# Patient Record
Sex: Male | Born: 1985 | Race: Black or African American | Hispanic: No | Marital: Single | Smoking: Current every day smoker
Health system: Southern US, Community
[De-identification: ages and names within clinical notes are randomized; demographics above are authoritative.]

---

## 2006-06-05 ENCOUNTER — Emergency Department: Payer: Self-pay | Admitting: Unknown Physician Specialty

## 2008-03-09 IMAGING — CR DG CHEST 2V
1 series · 2 of 2 positions shown · non-contrast
Comparison: none

REASON FOR EXAM: Cough
COMMENTS:

PROCEDURE:     DXR - DXR CHEST PA (OR AP) AND LATERAL  - June 05, 2006  [DATE]
RESULT:     PA and lateral views of the chest show the lungs are
hyperinflated consistent with COPD. The heart and pulmonary vessels are
normal. The bony structures are unremarkable.

[Series 1: view not recorded · 0.17mm/px · 2 of 2 slices shown]
[im 1/2]
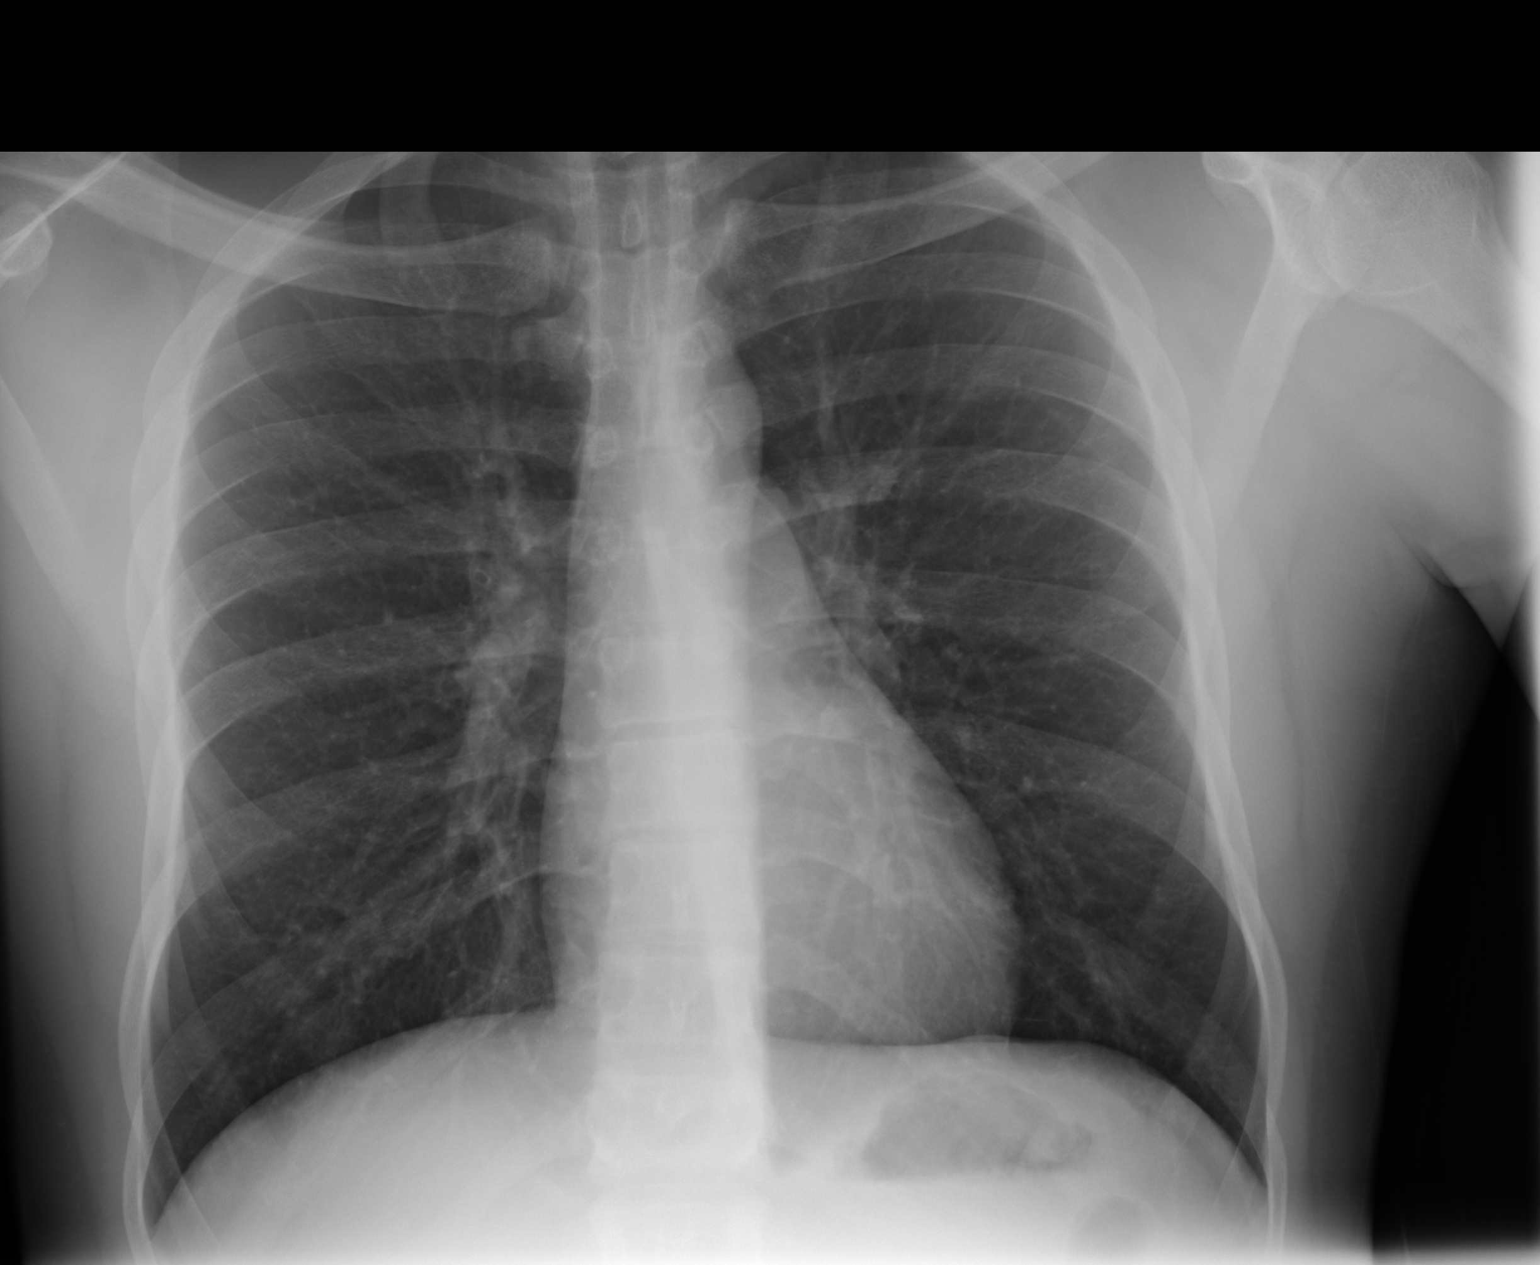
[im 2/2]
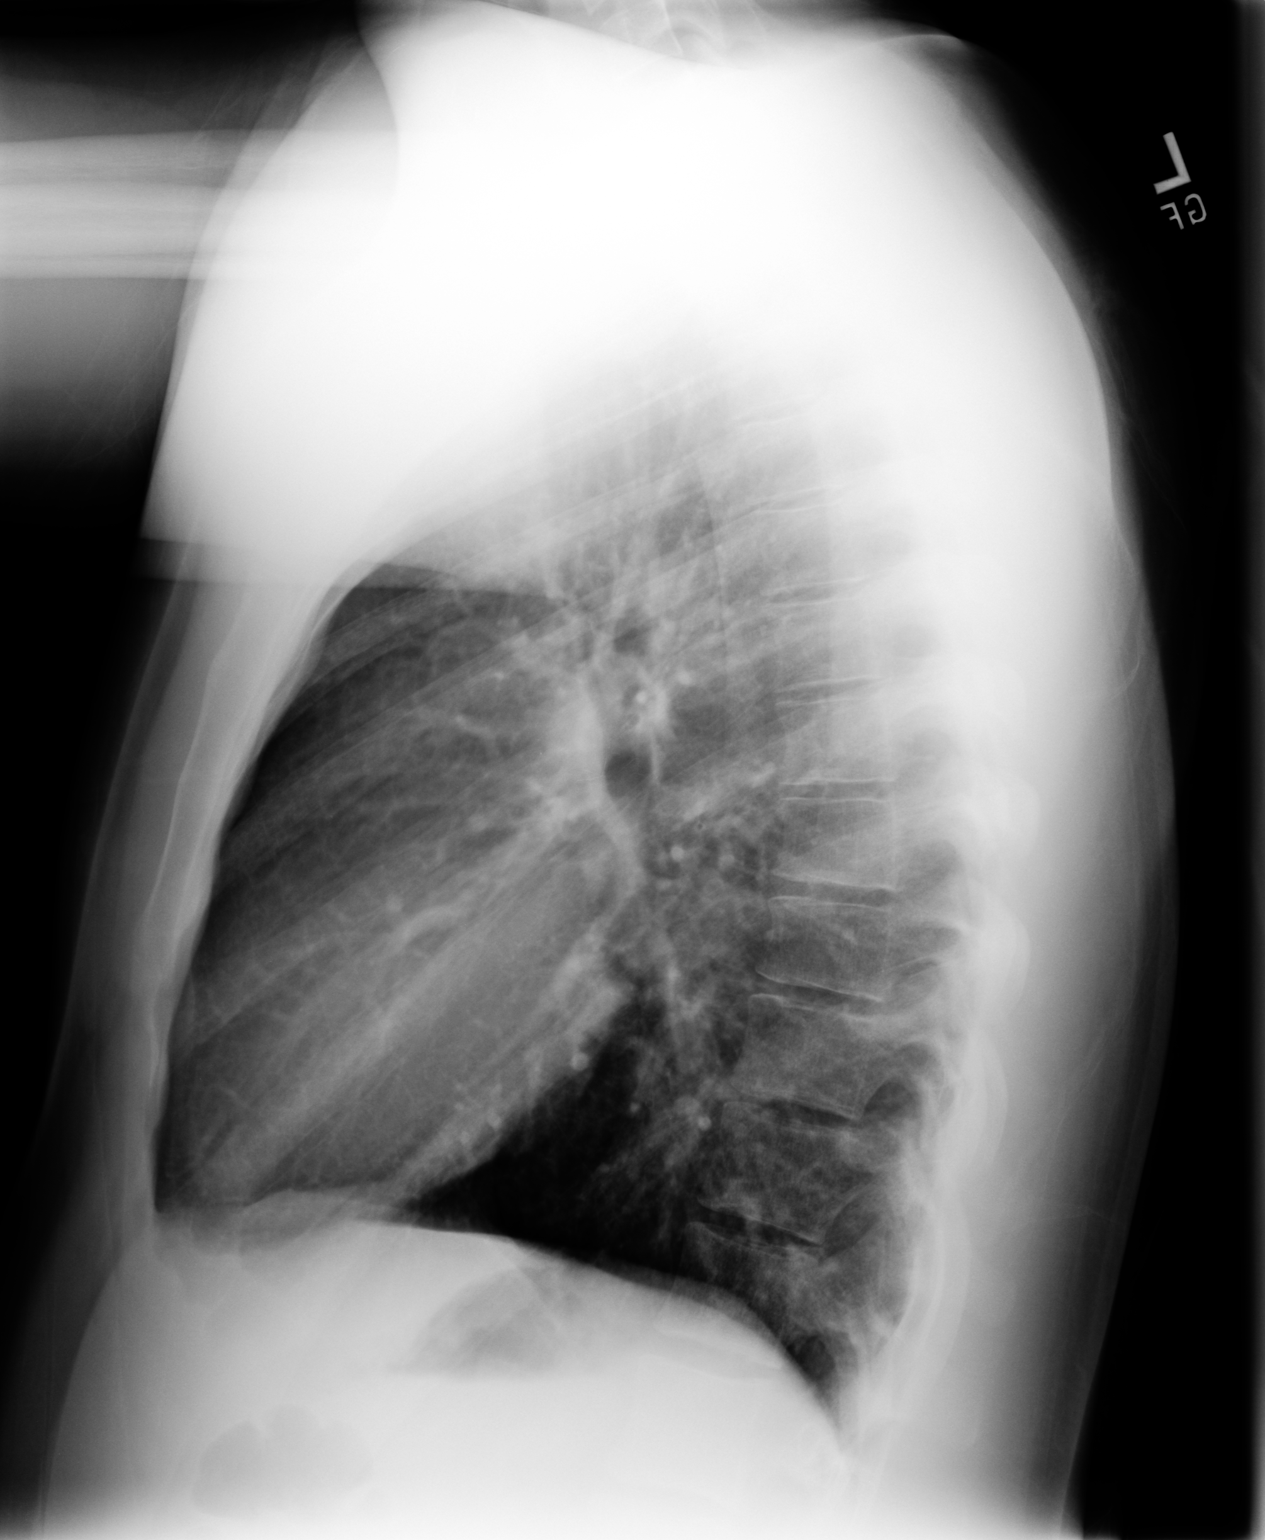

[2 of 2 positions shown; findings below may reference images not displayed]

IMPRESSION: No acute cardiopulmonary disease.

## 2014-03-01 ENCOUNTER — Emergency Department: Payer: Self-pay | Admitting: Internal Medicine

## 2015-09-10 ENCOUNTER — Emergency Department
Admission: EM | Admit: 2015-09-10 | Discharge: 2015-09-10 | Disposition: A | Discharge status: Home / Self Care | Payer: BLUE CROSS/BLUE SHIELD | Attending: Emergency Medicine | Admitting: Emergency Medicine

## 2015-09-10 ENCOUNTER — Encounter: Payer: Self-pay | Admitting: Emergency Medicine

## 2015-09-10 DIAGNOSIS — F17210 Nicotine dependence, cigarettes, uncomplicated: Secondary | ICD-10-CM | POA: Diagnosis not present

## 2015-09-10 DIAGNOSIS — R51 Headache: Secondary | ICD-10-CM | POA: Diagnosis present

## 2015-09-10 DIAGNOSIS — K0889 Other specified disorders of teeth and supporting structures: Secondary | ICD-10-CM | POA: Insufficient documentation

## 2015-09-10 MED ORDER — HYDROCODONE-ACETAMINOPHEN 5-325 MG PO TABS: 1.0000 | ORAL_TABLET | ORAL | Status: AC | PRN

## 2015-09-10 MED ORDER — IBUPROFEN 800 MG PO TABS: 800.0000 mg | ORAL_TABLET | Freq: Three times a day (TID) | ORAL | Status: AC | PRN

## 2015-09-10 MED ORDER — OXYCODONE-ACETAMINOPHEN 5-325 MG PO TABS
1.0000 | ORAL_TABLET | ORAL | Status: DC | PRN
  Administered 2015-09-10: 1 via ORAL
  Filled 2015-09-10: qty 1

## 2015-09-10 MED ORDER — LIDOCAINE VISCOUS 2 % MT SOLN: 20.0000 mL | OROMUCOSAL | Status: AC | PRN

## 2015-09-10 NOTE — Discharge Instructions (Signed)
OPTIONS FOR DENTAL FOLLOW UP CARE ° °[place] [facility] and [facility] - Local Safety Net Dental Clinics °http://www.ncdhhs.gov/dph/oralhealth/services/safetynetclinics.htm °  °[facility] (336-562-3123) ° °[facility] [place] (919-933-9087) ° °[facility] ([phone] ext [phone]) ° °[facility] ([phone]) ° °[facility] ([phone]) °This clinic caters to the indigent population and is on a lottery system. °Location: °[facility], [address], [place] °Clinic Hours: °Wednesdays from 6pm - 9pm, patients seen by a lottery system. °For dates, call or go to [email or web address].[facility].edu/[facility]/patients/Dental-[facility] °Services: °Cleanings, fillings and simple extractions. °Payment Options: °DENTAL WORK IS FREE OF CHARGE. Bring proof of income or support. °Best way to get seen: °Arrive at 5:15 pm - this is a lottery, NOT first come/first serve, so arriving earlier will not increase your chances of being seen. °  °  °[facility] °[phone] °Select option 1 for emergencies °  °Location: °[facility], [address], [place] °Clinic Hours: °No walk-ins accepted - call the day before to schedule an appointment. °Check in times are 9:30 am and 1:30 pm. °Services: °Simple extractions, temporary fillings, pulpectomy/pulp debridement, uncomplicated abscess drainage. °Payment Options: °PAYMENT IS DUE AT THE TIME OF SERVICE.  Fee is usually $100-200, additional surgical procedures (e.g. abscess drainage) may be extra. °Cash, checks, Visa/MasterCard accepted.  Can file Medicaid if patient is covered for dental - patient should call case worker to check. °No discount for [facility] patients. °Best way to get seen: °MUST call the day before and get onto the schedule. Can usually be seen the next 1-2 days. No walk-ins accepted. °  °  °[facility] °[phone] °   °Location: °[facility], [address], [place] °Clinic Hours: °M, W, Th, F 8am or 1:30pm, Tues 9a or 1:30 - first come/first served. °Services: °Simple extractions, temporary fillings, uncomplicated abscess drainage.  You do not need to be an [place] resident. °Payment Options: °PAYMENT IS DUE AT THE TIME OF SERVICE. °Dental insurance, otherwise sliding scale - bring proof of income or support. °Depending on income and treatment needed, cost is usually $50-200. °Best way to get seen: °Arrive early as it is first come/first served. °  °  °[facility] °[phone] °  °Location: °[address] °Clinic Hours: °Mon-Thu 8a-5p °Services: °Most basic dental services including extractions and fillings. °Payment Options: °PAYMENT IS DUE AT THE TIME OF SERVICE. °Sliding scale, up to 50% off - bring proof if income or support. °Medicaid with dental option accepted. °Best way to get seen: °Call to schedule an appointment, can usually be seen within 2 weeks OR they will try to see walk-ins - show up at 8a or 2p (you may have to wait). °  °  °[facility] °919-245-2435 °[place] RESIDENTS ONLY °  °Location: °[facility], [address], [place], [place] [address] °Clinic Hours: By appointment only. °Monday - Thursday 8am-5pm, Friday 8am-12pm °Services: Cleanings, fillings, extractions. °Payment Options: °PAYMENT IS DUE AT THE TIME OF SERVICE. °Cash, Visa or MasterCard. Sliding scale - $30 minimum per service. °Best way to get seen: °Come in to office, complete packet and make an appointment - need proof of income °or support monies for each household member and proof of [place] residence. °Usually takes about a month to get in. °  °  °[facility] °[phone] °  °Location: °[address]   [place] °Clinic Hours: Walk-in Urgent Care Dental Services are offered Monday-Friday  mornings only. °The numbers of emergencies accepted daily is limited to the number of °providers available. °Maximum 15 - Mondays, Wednesdays & Thursdays °Maximum 10 - Tuesdays & Fridays °Services: °You do not need to be a [place] resident to be seen for a dental emergency. °Emergencies are defined as pain, swelling, abnormal bleeding, or dental trauma. Walkins will receive x-rays if needed. °NOTE: Dental cleaning is not an emergency. °Payment Options: °PAYMENT IS DUE AT THE TIME OF SERVICE. °Minimum co-pay is $40.00 for uninsured patients. °Minimum co-pay is $3.00 for Medicaid with dental coverage. °Dental Insurance is accepted and must be presented at time of visit. °Medicare does not cover dental. °Forms of payment: Cash, credit card, checks. °Best way to get seen: °If not previously registered with the clinic, walk-in dental registration begins at 7:15 am and is on a first come/first serve basis. °If previously registered with the clinic, call to make an appointment. °  °  °[facility] °[phone] °[place] RESIDENTS ONLY °  °Location: °[address], [place], [place] °Clinic Hours: °Mon-Thu 10a-2p °Services: Extractions only! °Payment Options: °FREE (donations accepted) - bring proof of income or support °Best way to get seen: °Call and schedule an appointment OR come at 8am on the 1st Monday of every month (except for holidays) when it is first come/first served. °  °  °[facility] °[phone] °  °Location: °[address], [place] °Clinic Hours: °Friday mornings °Services, Payment Options, Best way to get seen: °Call for info °
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
## 2015-09-10 NOTE — ED Notes (Signed)
Pt presents to ED with c/o sided headache and right upper dental x2 days. Pt thinks his headache is trigger by dental pain. No other focal neuro symptoms. Pt states he tried ibuprofen and tylenol with relieve.

## 2021-12-17 ENCOUNTER — Emergency Department: Payer: BLUE CROSS/BLUE SHIELD

## 2021-12-17 ENCOUNTER — Emergency Department
Admission: EM | Admit: 2021-12-17 | Discharge: 2021-12-17 | Disposition: A | Discharge status: Home / Self Care | Payer: BLUE CROSS/BLUE SHIELD | Attending: Emergency Medicine | Admitting: Emergency Medicine

## 2021-12-17 ENCOUNTER — Other Ambulatory Visit: Payer: Self-pay

## 2021-12-17 ENCOUNTER — Encounter: Payer: Self-pay | Admitting: Emergency Medicine

## 2021-12-17 DIAGNOSIS — S0181XA Laceration without foreign body of other part of head, initial encounter: Secondary | ICD-10-CM

## 2021-12-17 DIAGNOSIS — S0990XA Unspecified injury of head, initial encounter: Secondary | ICD-10-CM | POA: Insufficient documentation

## 2021-12-17 DIAGNOSIS — Z23 Encounter for immunization: Secondary | ICD-10-CM | POA: Insufficient documentation

## 2021-12-17 DIAGNOSIS — S0182XA Laceration with foreign body of other part of head, initial encounter: Secondary | ICD-10-CM | POA: Insufficient documentation

## 2021-12-17 MED ORDER — LIDOCAINE HCL (PF) 1 % IJ SOLN
5.0000 mL | Freq: Once | INTRAMUSCULAR | Status: AC
  Administered 2021-12-17: 5 mL via INTRADERMAL
  Filled 2021-12-17: qty 5

## 2021-12-17 MED ORDER — LIDOCAINE-EPINEPHRINE-TETRACAINE (LET) TOPICAL GEL
3.0000 mL | Freq: Once | TOPICAL | Status: AC
  Administered 2021-12-17: 3 mL via TOPICAL
  Filled 2021-12-17: qty 3

## 2021-12-17 MED ORDER — BACITRACIN ZINC 500 UNIT/GM EX OINT
TOPICAL_OINTMENT | Freq: Once | CUTANEOUS | Status: AC
  Administered 2021-12-17: 1 via TOPICAL
  Filled 2021-12-17: qty 0.9

## 2021-12-17 MED ORDER — TETANUS-DIPHTH-ACELL PERTUSSIS 5-2.5-18.5 LF-MCG/0.5 IM SUSY
0.5000 mL | PREFILLED_SYRINGE | Freq: Once | INTRAMUSCULAR | Status: AC
  Administered 2021-12-17: 0.5 mL via INTRAMUSCULAR
  Filled 2021-12-17: qty 0.5

## 2021-12-17 NOTE — Discharge Instructions (Signed)
Follow up in 5 to 7 days for suture removal, you may go to urgent care, your regular doctor or return to the ER Wash the wounds with soap and water only Apply cocoa butter or mederma after sutures removed to help prevent scarring
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
## 2021-12-23 ENCOUNTER — Encounter: Payer: Self-pay | Admitting: Emergency Medicine

## 2021-12-23 ENCOUNTER — Emergency Department
Admission: EM | Admit: 2021-12-23 | Discharge: 2021-12-23 | Disposition: A | Discharge status: Home / Self Care | Payer: Self-pay | Attending: Emergency Medicine | Admitting: Emergency Medicine

## 2021-12-23 ENCOUNTER — Other Ambulatory Visit: Payer: Self-pay

## 2021-12-23 DIAGNOSIS — S0181XD Laceration without foreign body of other part of head, subsequent encounter: Secondary | ICD-10-CM | POA: Insufficient documentation

## 2021-12-23 DIAGNOSIS — Z4802 Encounter for removal of sutures: Secondary | ICD-10-CM | POA: Insufficient documentation

## 2021-12-23 DIAGNOSIS — W228XXD Striking against or struck by other objects, subsequent encounter: Secondary | ICD-10-CM | POA: Insufficient documentation
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
## 2021-12-23 NOTE — ED Triage Notes (Signed)
Patient to ED from home for stitches removal. Stitches on forehead and left side of mouth.

## 2021-12-23 NOTE — Discharge Instructions (Signed)
-  Apply a topical antibiotic ointment to the wound site daily for the next 3 to 4 days.  -Return to the emergency department anytime if you begin to experience any new or worsening symptoms.
